# Patient Record
Sex: Male | Born: 1995 | Race: White | Hispanic: No | Marital: Single | Smoking: Current every day smoker
Health system: Southern US, Community
[De-identification: ages and names within clinical notes are randomized; demographics above are authoritative.]

## PROBLEM LIST (undated history)

## (undated) DIAGNOSIS — F32A Depression, unspecified: Secondary | ICD-10-CM

## (undated) DIAGNOSIS — F329 Major depressive disorder, single episode, unspecified: Secondary | ICD-10-CM

## (undated) DIAGNOSIS — F419 Anxiety disorder, unspecified: Secondary | ICD-10-CM

## (undated) HISTORY — PX: ORTHOPEDIC SURGERY: SHX850

## (undated) HISTORY — PX: WRIST SURGERY: SHX841

---
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
## 2016-09-29 ENCOUNTER — Emergency Department (HOSPITAL_COMMUNITY)
Admission: EM | Admit: 2016-09-29 | Discharge: 2016-09-29 | Disposition: A | Discharge status: Home / Self Care | Payer: 59 | Attending: Emergency Medicine | Admitting: Emergency Medicine

## 2016-09-29 ENCOUNTER — Emergency Department (HOSPITAL_COMMUNITY): Payer: 59

## 2016-09-29 ENCOUNTER — Encounter (HOSPITAL_COMMUNITY): Payer: Self-pay | Admitting: Emergency Medicine

## 2016-09-29 DIAGNOSIS — Y92410 Unspecified street and highway as the place of occurrence of the external cause: Secondary | ICD-10-CM | POA: Diagnosis not present

## 2016-09-29 DIAGNOSIS — Z23 Encounter for immunization: Secondary | ICD-10-CM | POA: Insufficient documentation

## 2016-09-29 DIAGNOSIS — F10120 Alcohol abuse with intoxication, uncomplicated: Secondary | ICD-10-CM | POA: Insufficient documentation

## 2016-09-29 DIAGNOSIS — Y999 Unspecified external cause status: Secondary | ICD-10-CM | POA: Insufficient documentation

## 2016-09-29 DIAGNOSIS — S0181XA Laceration without foreign body of other part of head, initial encounter: Secondary | ICD-10-CM | POA: Diagnosis not present

## 2016-09-29 DIAGNOSIS — F10920 Alcohol use, unspecified with intoxication, uncomplicated: Secondary | ICD-10-CM

## 2016-09-29 DIAGNOSIS — S0990XA Unspecified injury of head, initial encounter: Secondary | ICD-10-CM | POA: Diagnosis present

## 2016-09-29 DIAGNOSIS — Y939 Activity, unspecified: Secondary | ICD-10-CM | POA: Insufficient documentation

## 2016-09-29 MED ORDER — TETANUS-DIPHTH-ACELL PERTUSSIS 5-2.5-18.5 LF-MCG/0.5 IM SUSP
0.5000 mL | Freq: Once | INTRAMUSCULAR | Status: AC
  Administered 2016-09-29: 0.5 mL via INTRAMUSCULAR
  Filled 2016-09-29: qty 0.5

## 2016-09-29 MED ORDER — BACITRACIN ZINC 500 UNIT/GM EX OINT
TOPICAL_OINTMENT | Freq: Once | CUTANEOUS | Status: AC
  Administered 2016-09-29: 1 via TOPICAL
  Filled 2016-09-29: qty 0.9

## 2016-09-29 MED ORDER — LIDOCAINE-EPINEPHRINE (PF) 2 %-1:200000 IJ SOLN
10.0000 mL | Freq: Once | INTRAMUSCULAR | Status: AC
  Administered 2016-09-29: 10 mL via INTRADERMAL
  Filled 2016-09-29: qty 20
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
## 2016-09-29 NOTE — ED Notes (Signed)
Patient transported to CT 

## 2016-09-29 NOTE — Discharge Instructions (Signed)
Please have your sutures removed in 7-10 days. You may alternate between Tylenol 1000 mg every 6 hours as needed for pain and ibuprofen 800 mg every 8 hours as needed for pain.  Please avoid alcohol for the next week.  Please rest and drink plenty of water.  We recommend that you avoid any activity that may lead to another head injury for at least 1 week or until your symptoms have completely resolved.  We also recommend "brain rest" - please avoid TV, cell phones, tablets, computers as much as possible for the next 48 hours.     To find a primary care or specialty doctor please call [phone] or [phone] to access "[place] Find a Doctor Service."  You may also go on the [facility] website at [email or web address]  There are also multiple [facility], [facility], [facility] and [facility] practices throughout the [place] that are frequently accepting new patients. You may find a clinic that is close to your home and contact them.  [facility] -  [address] [place] [id number] [phone]   [facility] -  [address] [place] [id number] [phone]   [facility] [phone]  [place] [phone]

## 2016-09-29 NOTE — ED Provider Notes (Signed)
LACERATION REPAIR Performed by: Barrett HenleNicole Elizabeth Nadeau Authorized by: Barrett HenleNicole Elizabeth Nadeau Consent: Verbal consent obtained. Risks and benefits: risks, benefits and alternatives were discussed Consent given by: patient Patient identity confirmed: provided demographic data Prepped and Draped in normal sterile fashion Wound explored  Laceration Location: forehead  Laceration Length: 8cm  No Foreign Bodies seen or palpated  Anesthesia: local infiltration  Local anesthetic: lidocaine 2% with epinephrine  Anesthetic total: 4 ml  Irrigation method: syringe Amount of cleaning: standard  Skin closure: 6-0 prolene  Number of sutures: 10  Technique: simple interrupted  Patient tolerance: Patient tolerated the procedure well with no immediate complications.   Satira Sarkicole Elizabeth WoodruffNadeau, [place]PA-C 09/29/16 0502
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
## 2016-09-30 ENCOUNTER — Encounter: Payer: Self-pay | Admitting: Emergency Medicine

## 2016-10-07 ENCOUNTER — Emergency Department
Admission: EM | Admit: 2016-10-07 | Discharge: 2016-10-07 | Disposition: A | Discharge status: Home / Self Care | Payer: 59 | Attending: Emergency Medicine | Admitting: Emergency Medicine

## 2016-10-07 ENCOUNTER — Encounter: Payer: Self-pay | Admitting: Emergency Medicine

## 2016-10-07 DIAGNOSIS — F17210 Nicotine dependence, cigarettes, uncomplicated: Secondary | ICD-10-CM | POA: Diagnosis not present

## 2016-10-07 DIAGNOSIS — F1290 Cannabis use, unspecified, uncomplicated: Secondary | ICD-10-CM | POA: Diagnosis not present

## 2016-10-07 DIAGNOSIS — Z79899 Other long term (current) drug therapy: Secondary | ICD-10-CM | POA: Diagnosis not present

## 2016-10-07 DIAGNOSIS — Z4802 Encounter for removal of sutures: Secondary | ICD-10-CM | POA: Insufficient documentation

## 2016-10-07 NOTE — ED Triage Notes (Signed)
Pt here to  Have sutures removed from forehead.
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
## 2017-10-25 ENCOUNTER — Emergency Department: Payer: 59

## 2017-10-25 ENCOUNTER — Emergency Department
Admission: EM | Admit: 2017-10-25 | Discharge: 2017-10-25 | Disposition: A | Discharge status: Home / Self Care | Payer: 59 | Attending: Emergency Medicine | Admitting: Emergency Medicine

## 2017-10-25 ENCOUNTER — Other Ambulatory Visit: Payer: Self-pay

## 2017-10-25 ENCOUNTER — Encounter: Payer: Self-pay | Admitting: Emergency Medicine

## 2017-10-25 DIAGNOSIS — S0990XA Unspecified injury of head, initial encounter: Secondary | ICD-10-CM | POA: Diagnosis not present

## 2017-10-25 DIAGNOSIS — Y939 Activity, unspecified: Secondary | ICD-10-CM | POA: Diagnosis not present

## 2017-10-25 DIAGNOSIS — W228XXA Striking against or struck by other objects, initial encounter: Secondary | ICD-10-CM | POA: Insufficient documentation

## 2017-10-25 DIAGNOSIS — F17210 Nicotine dependence, cigarettes, uncomplicated: Secondary | ICD-10-CM | POA: Insufficient documentation

## 2017-10-25 DIAGNOSIS — Y929 Unspecified place or not applicable: Secondary | ICD-10-CM | POA: Diagnosis not present

## 2017-10-25 DIAGNOSIS — Y999 Unspecified external cause status: Secondary | ICD-10-CM | POA: Diagnosis not present

## 2017-10-25 LAB — BASIC METABOLIC PANEL
Anion gap: 9 (ref 5–15)
BUN: 14 mg/dL (ref 6–20)
CHLORIDE: 104 mmol/L (ref 101–111)
CO2: 27 mmol/L (ref 22–32)
Calcium: 9.3 mg/dL (ref 8.9–10.3)
Creatinine, Ser: 1.03 mg/dL (ref 0.61–1.24)
GFR calc non Af Amer: >60 mL/min (ref >60)
Glucose, Bld: 101 mg/dL — ABNORMAL HIGH (ref 65–99)
POTASSIUM: 4.2 mmol/L (ref 3.5–5.1)
Sodium: 140 mmol/L (ref 135–145)

## 2017-10-25 LAB — URINALYSIS, COMPLETE (UACMP) WITH MICROSCOPIC
BACTERIA UA: NONE SEEN
Bilirubin Urine: NEGATIVE
Glucose, UA: NEGATIVE mg/dL
Hgb urine dipstick: NEGATIVE
Ketones, ur: NEGATIVE mg/dL
LEUKOCYTES UA: NEGATIVE
Nitrite: NEGATIVE
PROTEIN: NEGATIVE mg/dL
Specific Gravity, Urine: 1.02 (ref 1.005–1.030)
Squamous Epithelial / LPF: NONE SEEN
pH: 7 (ref 5.0–8.0)

## 2017-10-25 LAB — CBC
HEMATOCRIT: 48.2 % (ref 40.0–52.0)
Hemoglobin: 16.7 g/dL (ref 13.0–18.0)
MCH: 33 pg (ref 26.0–34.0)
MCHC: 34.7 g/dL (ref 32.0–36.0)
MCV: 95.2 fL (ref 80.0–100.0)
Platelets: 330 10*3/uL (ref 150–440)
RBC: 5.06 MIL/uL (ref 4.40–5.90)
RDW: 12.4 % (ref 11.5–14.5)
WBC: 9.9 10*3/uL (ref 3.8–10.6)

## 2017-10-25 MED ORDER — NAPROXEN 500 MG PO TABS: 500.0000 mg | ORAL_TABLET | Freq: Two times a day (BID) | ORAL | 2 refills | Status: DC
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
## 2017-10-25 NOTE — ED Notes (Signed)
ED Provider at bedside. 

## 2017-10-25 NOTE — ED Notes (Signed)
Verbal report to Owens CorningStephen

## 2017-10-25 NOTE — ED Notes (Signed)
D/w Dr. Roxan Hockeyobinson, ok to order CT head.
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
## 2017-10-29 LAB — GLUCOSE, CAPILLARY: Glucose-Capillary: 94 mg/dL (ref 65–99)

## 2017-12-14 ENCOUNTER — Encounter: Payer: Self-pay | Admitting: Emergency Medicine

## 2017-12-14 ENCOUNTER — Emergency Department
Admission: EM | Admit: 2017-12-14 | Discharge: 2017-12-15 | Disposition: A | Discharge status: Other Acute Inpatient Hospital | Payer: 59 | Attending: Emergency Medicine | Admitting: Emergency Medicine

## 2017-12-14 ENCOUNTER — Other Ambulatory Visit: Payer: Self-pay

## 2017-12-14 DIAGNOSIS — F333 Major depressive disorder, recurrent, severe with psychotic symptoms: Secondary | ICD-10-CM | POA: Insufficient documentation

## 2017-12-14 DIAGNOSIS — F17210 Nicotine dependence, cigarettes, uncomplicated: Secondary | ICD-10-CM | POA: Insufficient documentation

## 2017-12-14 DIAGNOSIS — Z79899 Other long term (current) drug therapy: Secondary | ICD-10-CM | POA: Diagnosis not present

## 2017-12-14 DIAGNOSIS — R45851 Suicidal ideations: Secondary | ICD-10-CM | POA: Diagnosis not present

## 2017-12-14 DIAGNOSIS — F329 Major depressive disorder, single episode, unspecified: Secondary | ICD-10-CM | POA: Diagnosis present

## 2017-12-14 HISTORY — DX: Depression, unspecified: F32.A

## 2017-12-14 HISTORY — DX: Major depressive disorder, single episode, unspecified: F32.9

## 2017-12-14 LAB — URINE DRUG SCREEN, QUALITATIVE (ARMC ONLY)
AMPHETAMINES, UR SCREEN: NOT DETECTED
BENZODIAZEPINE, UR SCRN: NOT DETECTED
Barbiturates, Ur Screen: NOT DETECTED
Cannabinoid 50 Ng, Ur ~~LOC~~: POSITIVE — AB
Cocaine Metabolite,Ur ~~LOC~~: NOT DETECTED
MDMA (Ecstasy)Ur Screen: NOT DETECTED
METHADONE SCREEN, URINE: NOT DETECTED
OPIATE, UR SCREEN: NOT DETECTED
Phencyclidine (PCP) Ur S: NOT DETECTED
TRICYCLIC, UR SCREEN: NOT DETECTED

## 2017-12-14 LAB — ACETAMINOPHEN LEVEL

## 2017-12-14 LAB — COMPREHENSIVE METABOLIC PANEL
ALT: 18 U/L (ref 17–63)
ANION GAP: 10 (ref 5–15)
AST: 28 U/L (ref 15–41)
Albumin: 5.1 g/dL — ABNORMAL HIGH (ref 3.5–5.0)
Alkaline Phosphatase: 61 U/L (ref 38–126)
BUN: 14 mg/dL (ref 6–20)
CHLORIDE: 104 mmol/L (ref 101–111)
CO2: 26 mmol/L (ref 22–32)
Calcium: 9.2 mg/dL (ref 8.9–10.3)
Creatinine, Ser: 0.92 mg/dL (ref 0.61–1.24)
GFR calc non Af Amer: >60 mL/min (ref >60)
GLUCOSE: 103 mg/dL — AB (ref 65–99)
POTASSIUM: 3.9 mmol/L (ref 3.5–5.1)
SODIUM: 140 mmol/L (ref 135–145)
Total Bilirubin: 0.7 mg/dL (ref 0.3–1.2)
Total Protein: 8 g/dL (ref 6.5–8.1)

## 2017-12-14 LAB — ETHANOL: Alcohol, Ethyl (B): 38 mg/dL — ABNORMAL HIGH (ref <10)

## 2017-12-14 LAB — CBC
HCT: 48.9 % (ref 40.0–52.0)
HEMOGLOBIN: 16.6 g/dL (ref 13.0–18.0)
MCH: 32.7 pg (ref 26.0–34.0)
MCHC: 34 g/dL (ref 32.0–36.0)
MCV: 96 fL (ref 80.0–100.0)
Platelets: 284 10*3/uL (ref 150–440)
RBC: 5.09 MIL/uL (ref 4.40–5.90)
RDW: 12.3 % (ref 11.5–14.5)
WBC: 6.9 10*3/uL (ref 3.8–10.6)

## 2017-12-14 LAB — SALICYLATE LEVEL

## 2017-12-14 NOTE — ED Notes (Signed)
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
## 2017-12-14 NOTE — ED Notes (Signed)
BEHAVIORAL HEALTH ROUNDING  Patient sleeping: No.  Patient alert and oriented: yes  Behavior appropriate: Yes. ; If no, describe:  Nutrition and fluids offered: Yes  Toileting and hygiene offered: Yes  Sitter present: not applicable, Q 15 min safety rounds and observation.  Law enforcement present: Yes ODS  

## 2017-12-14 NOTE — ED Notes (Signed)
Pt giving belongings including jewelry, cell phone and clothing to his parent to take home with them.

## 2017-12-15 ENCOUNTER — Encounter (HOSPITAL_COMMUNITY): Payer: Self-pay | Admitting: *Deleted

## 2017-12-15 ENCOUNTER — Other Ambulatory Visit: Payer: Self-pay

## 2017-12-15 ENCOUNTER — Inpatient Hospital Stay (HOSPITAL_COMMUNITY)
Admission: AD | Admit: 2017-12-15 | Discharge: 2017-12-17 | DRG: 882 | Disposition: A | Discharge status: Home / Self Care | Payer: 59 | Source: Intra-hospital | Attending: Psychiatry | Admitting: Psychiatry

## 2017-12-15 DIAGNOSIS — F332 Major depressive disorder, recurrent severe without psychotic features: Secondary | ICD-10-CM | POA: Diagnosis not present

## 2017-12-15 DIAGNOSIS — F1490 Cocaine use, unspecified, uncomplicated: Secondary | ICD-10-CM | POA: Diagnosis present

## 2017-12-15 DIAGNOSIS — F329 Major depressive disorder, single episode, unspecified: Secondary | ICD-10-CM | POA: Diagnosis present

## 2017-12-15 DIAGNOSIS — Z818 Family history of other mental and behavioral disorders: Secondary | ICD-10-CM

## 2017-12-15 DIAGNOSIS — R45851 Suicidal ideations: Secondary | ICD-10-CM | POA: Diagnosis present

## 2017-12-15 DIAGNOSIS — F322 Major depressive disorder, single episode, severe without psychotic features: Secondary | ICD-10-CM | POA: Diagnosis not present

## 2017-12-15 DIAGNOSIS — Z915 Personal history of self-harm: Secondary | ICD-10-CM | POA: Diagnosis not present

## 2017-12-15 DIAGNOSIS — G4710 Hypersomnia, unspecified: Secondary | ICD-10-CM | POA: Diagnosis present

## 2017-12-15 DIAGNOSIS — Z811 Family history of alcohol abuse and dependence: Secondary | ICD-10-CM | POA: Diagnosis not present

## 2017-12-15 DIAGNOSIS — F4310 Post-traumatic stress disorder, unspecified: Secondary | ICD-10-CM | POA: Diagnosis present

## 2017-12-15 DIAGNOSIS — F1290 Cannabis use, unspecified, uncomplicated: Secondary | ICD-10-CM | POA: Diagnosis present

## 2017-12-15 DIAGNOSIS — F1010 Alcohol abuse, uncomplicated: Secondary | ICD-10-CM | POA: Diagnosis present

## 2017-12-15 DIAGNOSIS — F1990 Other psychoactive substance use, unspecified, uncomplicated: Secondary | ICD-10-CM

## 2017-12-15 DIAGNOSIS — R51 Headache: Secondary | ICD-10-CM | POA: Diagnosis present

## 2017-12-15 DIAGNOSIS — Z881 Allergy status to other antibiotic agents status: Secondary | ICD-10-CM

## 2017-12-15 DIAGNOSIS — F17210 Nicotine dependence, cigarettes, uncomplicated: Secondary | ICD-10-CM | POA: Diagnosis present

## 2017-12-15 DIAGNOSIS — Z590 Homelessness: Secondary | ICD-10-CM | POA: Diagnosis not present

## 2017-12-15 DIAGNOSIS — Y901 Blood alcohol level of 20-39 mg/100 ml: Secondary | ICD-10-CM | POA: Diagnosis present

## 2017-12-15 HISTORY — DX: Anxiety disorder, unspecified: F41.9

## 2017-12-15 MED ORDER — TRAZODONE HCL 50 MG PO TABS
50.0000 mg | ORAL_TABLET | Freq: Every evening | ORAL | Status: DC | PRN
  Administered 2017-12-15: 50 mg via ORAL
  Filled 2017-12-15: qty 1

## 2017-12-15 MED ORDER — HYDROXYZINE HCL 25 MG PO TABS
25.0000 mg | ORAL_TABLET | Freq: Four times a day (QID) | ORAL | Status: DC | PRN
  Administered 2017-12-15: 25 mg via ORAL
  Filled 2017-12-15: qty 1

## 2017-12-15 MED ORDER — NICOTINE 21 MG/24HR TD PT24
21.0000 mg | MEDICATED_PATCH | Freq: Every day | TRANSDERMAL | Status: DC
  Administered 2017-12-15: 21 mg via TRANSDERMAL
  Filled 2017-12-15 (×5): qty 1

## 2017-12-15 MED ORDER — CITALOPRAM HYDROBROMIDE 10 MG PO TABS
10.0000 mg | ORAL_TABLET | Freq: Every day | ORAL | Status: DC
  Administered 2017-12-16 – 2017-12-17 (×2): 10 mg via ORAL
  Filled 2017-12-15 (×4): qty 1

## 2017-12-15 NOTE — ED Notes (Signed)
SHERIFF  DEPT  CALLED FOR  TRANSPORT  TO  [facility] 
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
## 2017-12-15 NOTE — ED Notes (Signed)
EMTALA reviewed by charge RN 
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
## 2017-12-15 NOTE — Progress Notes (Signed)
Adult Psychoeducational Group Note  Date:  12/15/2017 Time:  9:05 PM  Group Topic/Focus:  Wrap-Up Group:   The focus of this group is to help patients review their daily goal of treatment and discuss progress on daily workbooks.  Participation Level:  Active  Participation Quality:  Appropriate  Affect:  Appropriate  Cognitive:  Appropriate  Insight: Appropriate  Engagement in Group:  Engaged  Modes of Intervention:  Discussion  Additional Commen  Octavio Mannshigpen, Junious Ragone Lee 12/15/2017, 9:05 PM

## 2017-12-15 NOTE — ED Notes (Signed)
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
## 2017-12-15 NOTE — [facility] Group Notes (Signed)
[facility] LCSW Group Therapy Note  Date/Time: 12/15/17, 1315  Type of Therapy and Topic:  Group Therapy:  Overcoming Obstacles  Participation Level:  Did not attend  Description of Group:    In this group patients will be encouraged to explore what they see as obstacles to their own wellness and recovery. They will be guided to discuss their thoughts, feelings, and behaviors related to these obstacles. The group will process together ways to cope with barriers, with attention given to specific choices patients can make. Each patient will be challenged to identify changes they are motivated to make in order to overcome their obstacles. This group will be process-oriented, with patients participating in exploration of their own experiences as well as giving and receiving support and challenge from other group members.  Therapeutic Goals: 1. Patient will identify personal and current obstacles as they relate to admission. 2. Patient will identify barriers that currently interfere with their wellness or overcoming obstacles.  3. Patient will identify feelings, thought process and behaviors related to these barriers. 4. Patient will identify two changes they are willing to make to overcome these obstacles:    Summary of Patient Progress      Therapeutic Modalities:   Cognitive Behavioral Therapy Solution Focused Therapy Motivational Interviewing Relapse Prevention Therapy  Daleen SquibbGreg Raiana Pharris, LCSW

## 2017-12-15 NOTE — ED Provider Notes (Signed)
  Vitals:   12/14/17 2059 12/15/17 1011  BP: 128/89 128/71  Pulse: 87 68  Resp: 18 18  Temp: 98.6 F (37 C) 98.1 F (36.7 C)  SpO2: 96% 98%     No acute events reported to me overnight by nursing or physician sign out.  Reviewed TTS note, patient accepted for psychiatric placement, transport planned for this AM.  I completed transfer [place] form.    Governor RooksLord, Anaalicia Reimann, MD 12/15/17 1115

## 2017-12-15 NOTE — Progress Notes (Signed)
Patient ID: Marnee GuarneriJoshua B Wellborn, male   DOB: 09/08/1996, 22 y.o.   MRN: [id number]  Nursing Progress Note 1900-0730  D) Patient presents with anxious and depressed mood but is pleasant and cooperative with writer this evening. Patient reports "I was really overwhelmed today, that's why I stayed in my room a lot" but states, "I know this is where I need to be". Patient did attend group. Patient is seen interactive in the milieu. Patient denies SI/HI or pain. Patient contracts for safety on the unit. Patient reports auditory hallucinations. Patient requests medication for anxiety and sleep this evening.   A) Patient educated about and provided medication as scheduled or requested per provider's orders. Patient safety maintained with q15 min safety checks. Low fall risk precautions in place. Emotional support given. 1:1 interaction and active listening provided. Snacks and fluids provided. Labs, vital signs and patient behavior monitored throughout shift. Patient encouraged to work on treatment plan.  R) Patient remains safe on the unit at this time. Patient agrees to make needs known to staff. Will continue to monitor and assess for changes.

## 2017-12-15 NOTE — Plan of Care (Signed)
Nurse discussed anxiety, depression, coping skills with patient. 

## 2017-12-15 NOTE — BH Assessment (Signed)
Assessment Note  Leonard Bradford is an 22 y.o. male. Leonard Bradford arrived to the ED by driving himself.  He reports that he came in due to "Thoughts of wanting to harm myself really bad and thoughts of things I don't know what I want to do, and I came in before I did something to hurt myself or anybody".  He reports that he has been in a depressed state for "a couple of years, maybe longer".  He states "sometimes it's bearable, but mostly I stay in the house, avoid others, and at times talking to others causes me anger because I don't know how to express myself".  He reports having low energy. He reports having anxiety whenever he goes someplace new.  He states that he worries all the time of what others are thinking about him and if they are judging him.  He denied having auditory or visual hallucinations.  He reports having suicidal thoughts with no current plan.  He states that last year he has a gun, but it misfired on his suicide attempt.  He states that he is content with the ideal that if it does happen it happens. He reports using cocaine about a month ago, alcohol,    IVC paperwork reports "Suicidal ideation and major depression"  Diagnosis: Major Depressive Disorder  Past Medical History:  Past Medical History:  Diagnosis Date  . Depression     Past Surgical History:  Procedure Laterality Date  . ORTHOPEDIC SURGERY     wrist left  . WRIST SURGERY      Family History: History reviewed. No pertinent family history.  Social History:  reports that he has been smoking cigarettes.  He has been smoking about 0.50 packs per day. he has never used smokeless tobacco. He reports that he drinks alcohol. He reports that he uses drugs. Drug: Marijuana.  Additional Social History:  Alcohol / Drug Use History of alcohol / drug use?: Yes Substance #1 Name of Substance 1: Cocaine 1 - Age of First Use: 18 1 - Amount (size/oz): 1.5 grams 1 - Frequency: has not use in about a month/unsure 1 - Last  Use / Amount: January 2019 Substance #2 Name of Substance 2: Alcohol 2 - Age of First Use: 13-13 2 - Amount (size/oz): 1 to 4 2 - Frequency: daily 2 - Last Use / Amount: 12/14/2017  CIWA: CIWA-Ar BP: 128/89 Pulse Rate: 87 COWS:    Allergies:  Allergies  Allergen Reactions  . Augmentin [Amoxicillin-Pot Clavulanate] Swelling    Home Medications:  (Not in a hospital admission)  OB/GYN Status:  No LMP for male patient.  General Assessment Data Location of Assessment: [facility] ED TTS Assessment: In system Is this a Tele or Face-to-Face Assessment?: Face-to-Face Is this an Initial Assessment or a Re-assessment for this encounter?: Initial Assessment Marital status: Single Maiden name: n/a Is patient pregnant?: No Pregnancy Status: No Living Arrangements: Non-relatives/Friends Can pt return to current living arrangement?: No Admission Status: Involuntary Is patient capable of signing voluntary admission?: Yes Referral Source: Self/Family/Friend Insurance type: [place] [employer]  Medical Screening Exam [facility] Walk-in ONLY) Medical Exam completed: Yes  Crisis Care Plan Living Arrangements: Non-relatives/Friends Legal Guardian: Other:(Self) Name of Psychiatrist: None Name of Therapist: None  Education Status Is patient currently in school?: No Current Grade: n/a Highest grade of school patient has completed: GED Name of school: [employer] Contact person: n/a  Risk to self with the past 6 months Suicidal Ideation: Yes-Currently Present Has patient been a  risk to self within the past 6 months prior to admission? : Yes Suicidal Intent: No-Not Currently/Within Last 6 Months Has patient had any suicidal intent within the past 6 months prior to admission? : No Is patient at risk for suicide?: No Suicidal Plan?: No-Not Currently/Within Last 6 Months Has patient had any suicidal plan within the past 6 months prior to admission? : Yes Access to Means: No What has been your use of  drugs/alcohol within the last 12 months?: use of cocaine and alcohol Previous Attempts/Gestures: Yes How many times?: 8 Other Self Harm Risks: denied Triggers for Past Attempts: None known Intentional Self Injurious Behavior: None Family Suicide History: Yes(Uncle) Recent stressful life event(s): Other (Comment), Financial Problems(loss of housing, ) Persecutory voices/beliefs?: No Depression: Yes Depression Symptoms: Despondent, Feeling angry/irritable, Fatigue, Isolating, Feeling worthless/self pity Substance abuse history and/or treatment for substance abuse?: No Suicide prevention information given to non-admitted patients: Not applicable  Risk to Others within the past 6 months Homicidal Ideation: No Does patient have any lifetime risk of violence toward others beyond the six months prior to admission? : No Thoughts of Harm to Others: No Current Homicidal Intent: No Current Homicidal Plan: No Access to Homicidal Means: No Identified Victim: None identified History of harm to others?: No Assessment of Violence: None Noted Violent Behavior Description: denied Does patient have access to weapons?: No Criminal Charges Pending?: Yes Describe Pending Criminal Charges: DUI Does patient have a court date: Yes Court Date: 01/26/18 Is patient on probation?: No  Psychosis Hallucinations: None noted Delusions: None noted  Mental Status Report Appearance/Hygiene: In scrubs Eye Contact: Fair Motor Activity: Unremarkable Speech: Logical/coherent Level of Consciousness: Alert Mood: Depressed Affect: Flat Anxiety Level: Minimal Thought Processes: Coherent Judgement: Partial Orientation: Appropriate for developmental age Obsessive Compulsive Thoughts/Behaviors: None  Cognitive Functioning Concentration: Poor Memory: Remote Impaired IQ: Average Insight: Poor Impulse Control: Poor Appetite: Fair Sleep: Increased Vegetative Symptoms: Staying in bed  ADLScreening [facility]  Assessment Services) Patient's cognitive ability adequate to safely complete daily activities?: Yes Patient able to express need for assistance with ADLs?: Yes Independently performs ADLs?: Yes (appropriate for developmental age)  Prior Inpatient Therapy Prior Inpatient Therapy: No Prior Therapy Dates: n/a Prior Therapy Facilty/Provider(s): n/a Reason for Treatment: n/a  Prior Outpatient Therapy Prior Outpatient Therapy: No Prior Therapy Dates: n/a Prior Therapy Facilty/Provider(s): n/a Reason for Treatment: n/a Does patient have an ACCT team?: No Does patient have Intensive In-House Services?  : No Does patient have [facility] services? : No Does patient have P4CC services?: No  ADL Screening (condition at time of admission) Patient's cognitive ability adequate to safely complete daily activities?: Yes Is the patient deaf or have difficulty hearing?: No Does the patient have difficulty seeing, even when wearing glasses/contacts?: No Does the patient have difficulty concentrating, remembering, or making decisions?: No Patient able to express need for assistance with ADLs?: Yes Does the patient have difficulty dressing or bathing?: No Independently performs ADLs?: Yes (appropriate for developmental age) Does the patient have difficulty walking or climbing stairs?: Yes(Reports that his legs give out, has to sit down frequently ) Weakness of Legs: Both(Feels like legs may give out on him) Weakness of Arms/Hands: Both(States arms are easily fatigued)  Home Assistive Devices/Equipment Home Assistive Devices/Equipment: None    Abuse/Neglect Assessment (Assessment to be complete while patient is alone) Abuse/Neglect Assessment Can Be Completed: (Denied by patient)     Advance Directives (For Healthcare) Does Patient Have a Medical Advance Directive?: No Would patient like information on  creating a medical advance directive?: No - Patient declined    Additional Information 1:1 In  Past 12 Months?: No CIRT Risk: No Elopement Risk: No Does patient have medical clearance?: Yes     Disposition:  Disposition Initial Assessment Completed for this Encounter: Yes Disposition of Patient: Inpatient treatment program  On Site Evaluation by:   Reviewed with Physician:    Justice DeedsKeisha Firmin [place] 12/15/2017 12:09 AM

## 2017-12-15 NOTE — ED Notes (Signed)
Patient talking to mom on the phone. Patient is calm and no signs of distress.

## 2017-12-15 NOTE — Tx Team (Signed)
Initial Treatment Plan 12/15/2017 3:51 PM Marnee GuarneriJoshua B Bugay XBJ:[id number]    PATIENT STRESSORS: Educational concerns Financial difficulties Legal issue Medication change or noncompliance Occupational concerns Substance abuse   PATIENT STRENGTHS: Capable of independent living Wellsite geologistCommunication skills General fund of knowledge Motivation for treatment/growth Physical Health Work skills   PATIENT IDENTIFIED PROBLEMS: "anxiety"  "depression"   Suicidal thoughts"" "substamce abise                  DISCHARGE CRITERIA:  Ability to meet basic life and health needs Adequate post-discharge living arrangements Improved stabilization in mood, thinking, and/or behavior Medical problems require only outpatient monitoring Motivation to continue treatment in a less acute level of care Need for constant or close observation no longer present Reduction of life-threatening or endangering symptoms to within safe limits Safe-care adequate arrangements made Verbal commitment to aftercare and medication compliance Withdrawal symptoms are absent or subacute and managed without 24-hour nursing intervention  PRELIMINARY DISCHARGE PLAN: Attend aftercare/continuing care group Attend PHP/IOP Attend 12-step recovery group Outpatient therapy Participate in family therapy Placement in alternative living arrangements Return to previous work or school arrangements  PATIENT/FAMILY INVOLVEMENT: This treatment plan has been presented to and reviewed with the patient, Marnee GuarneriJoshua B Creeden..  The patient and family have been given the opportunity to ask questions and make suggestions.  Quintella ReichertKnight, Shaylynne Lunt [place] IslandShephard, [place]RN 12/15/2017, 3:51 PM
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
## 2017-12-15 NOTE — BHH Suicide Risk Assessment (Signed)
BHH INPATIENT:  Family/Significant Other Suicide Prevention Education  Suicide Prevention Education:  Education Completed; Simone CuriaMonisha Thompson, mother, [phone], has been identified by the patient as the family member/significant other with whom the patient will be residing, and identified as the person(s) who will aid the patient in the event of a mental health crisis (suicidal ideations/suicide attempt).  With written consent from the patient, the family member/significant other has been provided the following suicide prevention education, prior to the and/or following the discharge of the patient.  The suicide prevention education provided includes the following:  Suicide risk factors  Suicide prevention and interventions  National Suicide Hotline telephone number  [facility] assessment telephone number  [facility] Emergency Assistance 911  [facility] and/or Residential Mobile Crisis Unit telephone number  Request made of family/significant other to:  Remove weapons (e.g., guns, rifles, knives), all items previously/currently identified as safety concern.  No guns in the home, per [facility].  Remove drugs/medications (over-the-counter, prescriptions, illicit drugs), all items previously/currently identified as a safety concern. No excess medications in the home, per [facility].  The family member/significant other verbalizes understanding of the suicide prevention education information provided.  The family member/significant other agrees to remove the items of safety concern listed above.  Monisha said pt has been staying with his father for the past 18 months and they do not have a great relationship.  Pt has been depressed, says he has "no purpose" and worries he is "not going to be anything in life."  Pt has told her he is returning to live with her [facility].  She will be checking in on him regularly.  Lorri FrederickWierda, Margarite Vessel Jon, LCSW 12/15/2017,  1:03 PM
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
## 2017-12-15 NOTE — ED Notes (Signed)
[facility]  Patient assigned to room 405-Bed 1 Accepting physician is Dr. Rutherford GuysJonnagaladda.  Call report to [phone].  Representative was Loews CorporationJoann.   ER Staff is aware of it:  Bonita QuinLinda ER Sect.;  Dr. Zenda AlpersWebster, ER MD  Claris CheMargaret Patient's Nurse

## 2017-12-16 DIAGNOSIS — F1990 Other psychoactive substance use, unspecified, uncomplicated: Secondary | ICD-10-CM

## 2017-12-16 DIAGNOSIS — F4310 Post-traumatic stress disorder, unspecified: Secondary | ICD-10-CM

## 2017-12-16 DIAGNOSIS — F1290 Cannabis use, unspecified, uncomplicated: Secondary | ICD-10-CM

## 2017-12-16 DIAGNOSIS — F1490 Cocaine use, unspecified, uncomplicated: Secondary | ICD-10-CM

## 2017-12-16 DIAGNOSIS — F332 Major depressive disorder, recurrent severe without psychotic features: Secondary | ICD-10-CM

## 2017-12-16 DIAGNOSIS — F17210 Nicotine dependence, cigarettes, uncomplicated: Secondary | ICD-10-CM

## 2017-12-16 LAB — TSH: TSH: 2.274 u[IU]/mL (ref 0.350–4.500)

## 2017-12-16 MED ORDER — NICOTINE POLACRILEX 2 MG MT GUM
2.0000 mg | CHEWING_GUM | OROMUCOSAL | Status: DC | PRN
  Administered 2017-12-16: 2 mg via ORAL

## 2017-12-16 NOTE — BHH Suicide Risk Assessment (Signed)
BHH INPATIENT:  Family/Significant Other Suicide Prevention Education  Suicide Prevention Education:  Contact Attempts: Hubert AzureKristie Walmer, mother, [phone], has been identified by the patient as the family member/significant other with whom the patient will be residing, and identified as the person(s) who will aid the patient in the event of a mental health crisis.  With written consent from the patient, two attempts were made to provide suicide prevention education, prior to and/or following the patient's discharge.  We were unsuccessful in providing suicide prevention education.  A suicide education pamphlet was given to the patient to share with family/significant other.  Date and time of first attempt:12/16/17, 1430 Date and time of second attempt:  Lorri FrederickWierda, Sedonia Kitner Jon, LCSW 12/16/2017, 2:30 PM

## 2017-12-16 NOTE — Plan of Care (Signed)
Nurse discussed depression, anxiety, coping skills with patient.  
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
## 2017-12-16 NOTE — Progress Notes (Signed)
D:  Patient denied SI and HI, contracts for safety.  Denied A/V hallucinations.   A:  Medications administered per MD orders.  Emotional support and encouragement given patient. R:  Safety maintained with 15 minute checks.  Patient has been resting in bed this morning.
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
## 2017-12-16 NOTE — [facility] Group Notes (Signed)
[facility] Group Notes:  (Nursing/MHT/Case Management/Adjunct)  Date:  12/16/2017  Time: 4:00 p.m.  Type of Therapy:  Nurse Education  Participation Level:  Did Not Attend  Participation Quality:    Affect:    Cognitive:    Insight:    Engagement in Group:    Modes of Intervention:    Summary of Progress/Problems:  Earline MayotteKnight, Dewight Catino Shephard 12/16/2017, 5:48 PM

## 2017-12-16 NOTE — [facility] Group Notes (Signed)
LCSW Group Therapy Note 12/16/2017 12:46 PM  Type of Therapy/Topic: Group Therapy: Feelings about Diagnosis  Participation Level: Did Not Attend   Description of Group:  This group will allow patients to explore their thoughts and feelings about diagnoses they have received. Patients will be guided to explore their level of understanding and acceptance of these diagnoses. Facilitator will encourage patients to process their thoughts and feelings about the reactions of others to their diagnosis and will guide patients in identifying ways to discuss their diagnosis with significant others in their lives. This group will be process-oriented, with patients participating in exploration of their own experiences, giving and receiving support, and processing challenge from other group members.  Therapeutic Goals: 1. Patient will demonstrate understanding of diagnosis as evidenced by identifying two or more symptoms of the disorder 2. Patient will be able to express two feelings regarding the diagnosis 3. Patient will demonstrate their ability to communicate their needs through discussion and/or role play  Summary of Patient Progress:  Invited, chose not to attend.     Therapeutic Modalities:  Cognitive Behavioral Therapy Brief Therapy Feelings Identification    Makya Phillis Catalina AntiguaWilliams LCSWA Clinical Social Worker

## 2017-12-17 MED ORDER — CITALOPRAM HYDROBROMIDE 10 MG PO TABS: 10.0000 mg | ORAL_TABLET | Freq: Every day | ORAL | 0 refills | Status: AC

## 2017-12-17 MED ORDER — HYDROXYZINE HCL 25 MG PO TABS: 25.0000 mg | ORAL_TABLET | Freq: Four times a day (QID) | ORAL | 0 refills | Status: AC | PRN

## 2017-12-17 MED ORDER — TRAZODONE HCL 50 MG PO TABS: 50.0000 mg | ORAL_TABLET | Freq: Every evening | ORAL | 0 refills | Status: AC | PRN

## 2017-12-17 NOTE — Progress Notes (Signed)
  [facility] :  Will you be returning to the same living situation after discharge:  Yes,  with mother At discharge, do you have transportation home?: Yes,  mother Do you have the ability to pay for your medications: Yes,  [employer]  Release of information consent forms completed and in the chart;  Patient's signature needed at discharge.  Patient to Follow up at: Follow-up Information    [facility], [facility]. Go on 12/22/2017.   Why:  Please attend your intake appt on Monday, 12/22/17, at 9:30am.   Contact information: [address] [place] [phone]           Next level of care provider has access to [facility] Link:no  Safety Planning and Suicide Prevention discussed: Yes,  with mother  Have you used any form of tobacco in the last 30 days? (Cigarettes, Smokeless Tobacco, Cigars, and/or Pipes): Yes  Has patient been referred to the Quitline?: Patient refused referral  Patient has been referred for addiction treatment: Yes  Lorri FrederickWierda, Wilian Kwong Jon, LCSW 12/17/2017, 10:49 AM

## 2017-12-17 NOTE — [facility] Group Notes (Signed)
[facility] Mental Health Association Group Therapy 12/17/2017 1:15pm  Type of Therapy: Mental Health Association Presentation  Participation Level: Active  Participation Quality: Attentive  Affect: Appropriate  Cognitive: Oriented  Insight: Developing/Improving  Engagement in Therapy: Engaged  Modes of Intervention: Discussion, Education and Socialization  Summary of Progress/Problems: Mental Health Association (MHA) Speaker came to talk about his personal journey with mental health. The pt processed ways by which to relate to the speaker. MHA speaker provided handouts and educational information pertaining to groups and services offered by the MHA. Pt was engaged in speaker's presentation and was receptive to resources provided.    Nijah Orlich N Smart, LCSW 12/17/2017 9:35 AM  

## 2017-12-17 NOTE — Tx Team (Signed)
Interdisciplinary Treatment and Diagnostic Plan Update  12/17/2017 Time of Session: 4034 Leonard Bradford MRN: [id number]  Principal Diagnosis: PTSD (post-traumatic stress disorder)  Secondary Diagnoses: Principal Problem:   PTSD (post-traumatic stress disorder) Active Problems:   Substance use disorder   Current Medications:  Current Facility-Administered Medications  Medication Dose Route Frequency Provider Last Rate Last Dose  . citalopram (CELEXA) tablet 10 mg  10 mg Oral Daily Cobos, Rockey Situ, MD   10 mg at 12/17/17 0829  . hydrOXYzine (ATARAX/VISTARIL) tablet 25 mg  25 mg Oral Q6H PRN Cobos, Rockey Situ, MD   25 mg at 12/15/17 2104  . nicotine polacrilex (NICORETTE) gum 2 mg  2 mg Oral PRN Cobos, Rockey Situ, MD   2 mg at 12/16/17 1812  . traZODone (DESYREL) tablet 50 mg  50 mg Oral QHS PRN Cobos, Rockey Situ, MD   50 mg at 12/15/17 2104   PTA Medications: Medications Prior to Admission  Medication Sig Dispense Refill Last Dose  . naproxen (NAPROSYN) 500 MG tablet Take 1 tablet (500 mg total) by mouth 2 (two) times daily with a meal. (Patient not taking: Reported on 12/14/2017) 20 tablet 2 Not Taking at Unknown time  . nortriptyline (PAMELOR) 10 MG capsule Take 20 mg by mouth every evening.   12/13/2017 at Unknown time  . sulfamethoxazole-trimethoprim (BACTRIM DS,SEPTRA DS) 800-160 MG per tablet Take 1 tablet by mouth 2 (two) times daily. (Patient not taking: Reported on 12/14/2017) 20 tablet 0 Completed Course at Unknown time  . traMADol (ULTRAM) 50 MG tablet Take 1 tablet (50 mg total) by mouth every 6 (six) hours as needed for moderate pain. (Patient not taking: Reported on 12/14/2017) 12 tablet 0 Not Taking at Unknown time    Patient Stressors: Educational concerns Financial difficulties Legal issue Medication change or noncompliance Occupational concerns Substance abuse  Patient Strengths: Capable of independent living Wellsite geologist fund of  knowledge Motivation for treatment/growth Physical Health Work skills  Treatment Modalities: Medication Management, Group therapy, Case management,  1 to 1 session with clinician, Psychoeducation, Recreational therapy.   Physician Treatment Plan for Primary Diagnosis: PTSD (post-traumatic stress disorder) Long Term Goal(s): Improvement in symptoms so as ready for discharge Improvement in symptoms so as ready for discharge   Short Term Goals: Ability to identify changes in lifestyle to reduce recurrence of condition will improve Ability to maintain clinical measurements within normal limits will improve Ability to identify changes in lifestyle to reduce recurrence of condition will improve Ability to maintain clinical measurements within normal limits will improve  Medication Management: Evaluate patient's response, side effects, and tolerance of medication regimen.  Therapeutic Interventions: 1 to 1 sessions, Unit Group sessions and Medication administration.  Evaluation of Outcomes: Progressing  Physician Treatment Plan for Secondary Diagnosis: Principal Problem:   PTSD (post-traumatic stress disorder) Active Problems:   Substance use disorder  Long Term Goal(s): Improvement in symptoms so as ready for discharge Improvement in symptoms so as ready for discharge   Short Term Goals: Ability to identify changes in lifestyle to reduce recurrence of condition will improve Ability to maintain clinical measurements within normal limits will improve Ability to identify changes in lifestyle to reduce recurrence of condition will improve Ability to maintain clinical measurements within normal limits will improve     Medication Management: Evaluate patient's response, side effects, and tolerance of medication regimen.  Therapeutic Interventions: 1 to 1 sessions, Unit Group sessions and Medication administration.  Evaluation of Outcomes: Progressing   RN Treatment  Plan for Primary  Diagnosis: PTSD (post-traumatic stress disorder) Long Term Goal(s): Knowledge of disease and therapeutic regimen to maintain health will improve  Short Term Goals: Ability to identify and develop effective coping behaviors will improve and Compliance with prescribed medications will improve  Medication Management: RN will administer medications as ordered by provider, will assess and evaluate patient's response and provide education to patient for prescribed medication. RN will report any adverse and/or side effects to prescribing provider.  Therapeutic Interventions: 1 on 1 counseling sessions, Psychoeducation, Medication administration, Evaluate responses to treatment, Monitor vital signs and CBGs as ordered, Perform/monitor CIWA, COWS, AIMS and Fall Risk screenings as ordered, Perform wound care treatments as ordered.  Evaluation of Outcomes: Progressing   LCSW Treatment Plan for Primary Diagnosis: PTSD (post-traumatic stress disorder) Long Term Goal(s): Safe transition to appropriate next level of care at discharge, Engage patient in therapeutic group addressing interpersonal concerns.  Short Term Goals: Engage patient in aftercare planning with referrals and resources, Increase social support and Increase skills for wellness and recovery  Therapeutic Interventions: Assess for all discharge needs, 1 to 1 time with Social worker, Explore available resources and support systems, Assess for adequacy in community support network, Educate family and significant other(s) on suicide prevention, Complete Psychosocial Assessment, Interpersonal group therapy.  Evaluation of Outcomes: Progressing   Progress in Treatment: Attending groups: No. Participating in groups: No. Taking medication as prescribed: Yes. Toleration medication: Yes. Family/Significant other contact made: Yes, individual(s) contacted:  brother Patient understands diagnosis: Yes. Discussing patient identified problems/goals  with staff: Yes. Medical problems stabilized or resolved: Yes. Denies suicidal/homicidal ideation: Yes. Issues/concerns per patient self-inventory: No. Other: none  New problem(s) identified: No, Describe:  none  New Short Term/Long Term Goal(s):Pt goal: to keep my job and have a place to go to after I leave the hospital.  Discharge Plan or Barriers:   Reason for Continuation of Hospitalization: Depression Medication stabilization  Estimated Length of Stay: discharging today  Attendees: Patient:Leonard Bradford 12/17/2017   Physician: Dr Jackquline BerlinIzediuno, MD 12/17/2017   Nursing: Liborio NixonPatrice White, RN 12/17/2017   RN Care Manager: 12/17/2017   Social Worker: Daleen SquibbGreg Selestino Nila, LCSW 12/17/2017   Recreational Therapist:  12/17/2017   Other:  12/17/2017   Other:  12/17/2017   Other: 12/17/2017        Scribe for Treatment Team: Lorri FrederickWierda, Amarianna Abplanalp Jon, LCSW 12/17/2017 2:34 PM

## 2017-12-17 NOTE — [facility] Group Notes (Signed)
[facility] Group Therapy 12/17/2017 9:00am  Type of Therapy: Mental Health Association Presentation  Participation Level: Active  Participation Quality: Attentive  Affect: Appropriate  Cognitive: Oriented  Insight: Developing/Improving  Engagement in Therapy: Engaged  Modes of Intervention: Discussion, Education and Socialization  Summary of Progress/Problems: Mental Health Association (MHA) Speaker came to talk about his personal journey with mental health. The pt processed ways by which to relate to the speaker. MHA speaker provided handouts and educational information pertaining to groups and services offered by the [facility]. Pt was engaged in speaker's presentation and was receptive to resources provided.    Lorri FrederickWierda, Leonard Betty Jon, LCSW 12/17/2017 12:48 PM
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
## 2017-12-17 NOTE — Progress Notes (Signed)
Recreation Therapy Notes  Date: 12/17/17 Time: 0930 Location: [address] Hall Dayroom  Group Topic: Stress Management  Goal Area(s) Addresses:  Patient will verbalize importance of using healthy stress management.  Patient will identify positive emotions associated with healthy stress management.   Intervention: Stress Management  Activity :  Progressive Muscle Relaxation.  LRT introduced the stress management technique of progressive muscle relaxation.  LRT read a script to guide the patients through the process of tensing and relaxing each muscle group individually.    Education:  Stress Management, Discharge Planning.   Education Outcome: Acknowledges edcuation/In group clarification offered/Needs additional education  Clinical Observations/Feedback: Pt did not attend group.     Javaughn Opdahl, LRT/CTRS         Authur Cubit A 12/17/2017 12:03 PM 
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
## 2017-12-17 NOTE — Progress Notes (Signed)
Pt received both written and verbal discharge instructions. Pt verbalized understanding of discharge instructions. Pt agreed to f/u appt and med regimen. Pt received discharge packet and prescriptions. Pt gathered all belongings from room. Pt safely discharged to the lobby.

## 2017-12-17 NOTE — Progress Notes (Signed)
Psychoeducational Group Note  Date:  12/17/2017 Time:  0943  Group Topic/Focus:  Goals Group:   The focus of this group is to help patients establish daily goals to achieve during treatment and discuss how the patient can incorporate goal setting into their daily lives to aide in recovery.  Participation Level: Did Not Attend  Participation Quality:  Not Applicable  Affect:  Not Applicable  Cognitive:  Not Applicable  Insight:  Not Applicable  Engagement in Group: Not Applicable  Additional Comments:  Pt was meeting with the MD and unable to attend group this morning.  Jeanne Terrance E 12/17/2017, 9:44 AM

## 2017-12-17 NOTE — Progress Notes (Signed)
Pt attend wrap up group. His day was 9. He do not understand voluntary vs unvoluntary and 72 hours. Staff explain his nurse can explain this to him. Pt said he's happy he now has a place to go when he leaves here. Pt complain about his nurse. He told her he smoke mj to help him with pain issues he has and this clear his mind. Pt said no matter what he said from that point the nurse did not want to listen to him or help him understand or guide him. pt complain and said he is ready to leave. He wish he never came. The nurse, doctor and therapist has not been able to help him. Pt said he feel they have push him aside to get rid of him. No one wanted to listen to him. He has issue he want help with. Pt said he want to go out side. He loves animal and he want to have pet therapy. He was not give valid  answer today why they did not go outside. He did not like response "we see how that goes later."  The pt said there is no professional here and he can talk to the other patient on the hall to help him and he can do this at home on group calls. Pt said he did not like the nurse laughing at the patients. This did not make him feel welcome. RN notify and she was able to come up with a solution.
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
